# Patient Record
Sex: Male | Born: 1935 | Race: Black or African American | Hispanic: No | Marital: Single
Health system: Southern US, Community
[De-identification: ages and names within clinical notes are randomized; demographics above are authoritative.]

---

## 2007-03-07 ENCOUNTER — Emergency Department: Payer: Self-pay | Admitting: Emergency Medicine

## 2007-03-07 ENCOUNTER — Other Ambulatory Visit: Payer: Self-pay

## 2007-05-20 ENCOUNTER — Inpatient Hospital Stay: Payer: Self-pay | Admitting: Internal Medicine

## 2008-05-16 ENCOUNTER — Other Ambulatory Visit: Payer: Self-pay

## 2008-05-16 ENCOUNTER — Emergency Department: Payer: Self-pay | Admitting: Emergency Medicine

## 2009-03-26 ENCOUNTER — Emergency Department: Payer: Self-pay | Admitting: Unknown Physician Specialty

## 2009-08-21 ENCOUNTER — Emergency Department: Payer: Self-pay | Admitting: Emergency Medicine

## 2010-03-20 ENCOUNTER — Emergency Department: Payer: Self-pay | Admitting: Emergency Medicine

## 2011-07-29 ENCOUNTER — Emergency Department: Payer: Self-pay | Admitting: Emergency Medicine

## 2011-09-24 ENCOUNTER — Emergency Department: Payer: Self-pay | Admitting: Unknown Physician Specialty

## 2011-10-21 ENCOUNTER — Emergency Department: Payer: Self-pay | Admitting: Emergency Medicine

## 2011-10-21 LAB — COMPREHENSIVE METABOLIC PANEL
Albumin: 3.9 g/dL (ref 3.4–5.0)
Alkaline Phosphatase: 140 U/L — ABNORMAL HIGH (ref 50–136)
Anion Gap: 14 (ref 7–16)
BUN: 18 mg/dL (ref 7–18)
Bilirubin,Total: 0.6 mg/dL (ref 0.2–1.0)
Glucose: 125 mg/dL — ABNORMAL HIGH (ref 65–99)
Osmolality: 286 (ref 275–301)
Potassium: 4.5 mmol/L (ref 3.5–5.1)
Sodium: 142 mmol/L (ref 136–145)
Total Protein: 8.1 g/dL (ref 6.4–8.2)

## 2011-10-21 LAB — CBC
HCT: 37.4 % — ABNORMAL LOW (ref 40.0–52.0)
HGB: 12.7 g/dL — ABNORMAL LOW (ref 13.0–18.0)
MCH: 37.3 pg — ABNORMAL HIGH (ref 26.0–34.0)
MCHC: 34.1 g/dL (ref 32.0–36.0)
MCV: 110 fL — ABNORMAL HIGH (ref 80–100)
WBC: 3.8 10*3/uL (ref 3.8–10.6)

## 2011-10-21 LAB — PHENYTOIN LEVEL, TOTAL: Dilantin: 3.6 ug/mL — ABNORMAL LOW (ref 10.0–20.0)

## 2012-04-03 ENCOUNTER — Ambulatory Visit: Payer: Self-pay | Admitting: Otolaryngology

## 2012-04-15 ENCOUNTER — Ambulatory Visit: Payer: Self-pay | Admitting: Otolaryngology

## 2012-04-21 LAB — PATHOLOGY REPORT

## 2012-04-27 ENCOUNTER — Ambulatory Visit: Payer: Self-pay | Admitting: Radiation Oncology

## 2012-05-04 ENCOUNTER — Ambulatory Visit: Payer: Self-pay | Admitting: Radiation Oncology

## 2012-05-12 ENCOUNTER — Ambulatory Visit: Payer: Self-pay | Admitting: Vascular Surgery

## 2012-05-14 ENCOUNTER — Ambulatory Visit: Payer: Self-pay | Admitting: Radiation Oncology

## 2012-05-19 LAB — CBC CANCER CENTER
Basophil #: 0 x10 3/mm (ref 0.0–0.1)
Eosinophil %: 0.5 %
Lymphocyte #: 1.8 x10 3/mm (ref 1.0–3.6)
Lymphocyte %: 30.3 %
MCH: 36.6 pg — ABNORMAL HIGH (ref 26.0–34.0)
MCHC: 34.7 g/dL (ref 32.0–36.0)
MCV: 105 fL — ABNORMAL HIGH (ref 80–100)
Monocyte #: 1.2 x10 3/mm — ABNORMAL HIGH (ref 0.2–1.0)
Platelet: 202 x10 3/mm (ref 150–440)
RDW: 12.3 % (ref 11.5–14.5)

## 2012-05-19 LAB — COMPREHENSIVE METABOLIC PANEL
Alkaline Phosphatase: 167 U/L — ABNORMAL HIGH (ref 50–136)
Anion Gap: 8 (ref 7–16)
BUN: 22 mg/dL — ABNORMAL HIGH (ref 7–18)
Bilirubin,Total: 0.3 mg/dL (ref 0.2–1.0)
Chloride: 99 mmol/L (ref 98–107)
Creatinine: 0.92 mg/dL (ref 0.60–1.30)
EGFR (African American): >60
SGPT (ALT): 58 U/L (ref 12–78)
Sodium: 137 mmol/L (ref 136–145)
Total Protein: 8.5 g/dL — ABNORMAL HIGH (ref 6.4–8.2)

## 2012-05-20 ENCOUNTER — Emergency Department: Payer: Self-pay | Admitting: *Deleted

## 2012-05-20 LAB — CBC WITH DIFFERENTIAL/PLATELET
Basophil #: 0 10*3/uL (ref 0.0–0.1)
Eosinophil %: 0 %
HGB: 11.6 g/dL — ABNORMAL LOW (ref 13.0–18.0)
Lymphocyte #: 1 10*3/uL (ref 1.0–3.6)
Monocyte #: 1.3 x10 3/mm — ABNORMAL HIGH (ref 0.2–1.0)
Monocyte %: 15.2 %
Neutrophil %: 72.5 %
Platelet: 179 10*3/uL (ref 150–440)
RBC: 3.14 10*6/uL — ABNORMAL LOW (ref 4.40–5.90)
RDW: 12.4 % (ref 11.5–14.5)
WBC: 8.5 10*3/uL (ref 3.8–10.6)

## 2012-05-20 LAB — BASIC METABOLIC PANEL
BUN: 17 mg/dL (ref 7–18)
Calcium, Total: 9.6 mg/dL (ref 8.5–10.1)
Co2: 27 mmol/L (ref 21–32)
EGFR (African American): >60
Glucose: 95 mg/dL (ref 65–99)
Osmolality: 277 (ref 275–301)
Potassium: 4.4 mmol/L (ref 3.5–5.1)

## 2012-05-20 LAB — PHENYTOIN LEVEL, TOTAL: Dilantin: 1.3 ug/mL — ABNORMAL LOW (ref 10.0–20.0)

## 2012-05-26 LAB — CBC CANCER CENTER
Basophil #: 0 x10 3/mm (ref 0.0–0.1)
Basophil %: 0.3 %
Eosinophil #: 0 x10 3/mm (ref 0.0–0.7)
Eosinophil %: 0.4 %
HCT: 32.3 % — ABNORMAL LOW (ref 40.0–52.0)
HGB: 11 g/dL — ABNORMAL LOW (ref 13.0–18.0)
Lymphocyte #: 1.1 x10 3/mm (ref 1.0–3.6)
Lymphocyte %: 17.5 %
MCH: 35.5 pg — ABNORMAL HIGH (ref 26.0–34.0)
MCHC: 34.1 g/dL (ref 32.0–36.0)
MCV: 104 fL — ABNORMAL HIGH (ref 80–100)
Monocyte #: 1.4 x10 3/mm — ABNORMAL HIGH (ref 0.2–1.0)
Monocyte %: 22.3 %
Neutrophil #: 3.7 x10 3/mm (ref 1.4–6.5)
Neutrophil %: 59.5 %
Platelet: 206 x10 3/mm (ref 150–440)
RBC: 3.1 10*6/uL — ABNORMAL LOW (ref 4.40–5.90)
RDW: 12.2 % (ref 11.5–14.5)
WBC: 6.2 x10 3/mm (ref 3.8–10.6)

## 2012-05-26 LAB — COMPREHENSIVE METABOLIC PANEL
Anion Gap: 8 (ref 7–16)
BUN: 13 mg/dL (ref 7–18)
Bilirubin,Total: 0.3 mg/dL (ref 0.2–1.0)
Calcium, Total: 9.4 mg/dL (ref 8.5–10.1)
Chloride: 96 mmol/L — ABNORMAL LOW (ref 98–107)
Co2: 29 mmol/L (ref 21–32)
Creatinine: 0.72 mg/dL (ref 0.60–1.30)
EGFR (African American): >60
EGFR (Non-African Amer.): >60
Potassium: 3.8 mmol/L (ref 3.5–5.1)
SGOT(AST): 57 U/L — ABNORMAL HIGH (ref 15–37)
SGPT (ALT): 55 U/L (ref 12–78)
Sodium: 133 mmol/L — ABNORMAL LOW (ref 136–145)
Total Protein: 7.8 g/dL (ref 6.4–8.2)

## 2012-06-02 LAB — CBC CANCER CENTER
Eosinophil %: 0.4 %
HGB: 11.6 g/dL — ABNORMAL LOW (ref 13.0–18.0)
MCV: 105 fL — ABNORMAL HIGH (ref 80–100)
Monocyte %: 19.4 %
Neutrophil %: 66 %
Platelet: 218 x10 3/mm (ref 150–440)
WBC: 5.3 x10 3/mm (ref 3.8–10.6)

## 2012-06-02 LAB — COMPREHENSIVE METABOLIC PANEL
Alkaline Phosphatase: 169 U/L — ABNORMAL HIGH (ref 50–136)
Anion Gap: 6 — ABNORMAL LOW (ref 7–16)
BUN: 22 mg/dL — ABNORMAL HIGH (ref 7–18)
Bilirubin,Total: 0.3 mg/dL (ref 0.2–1.0)
Calcium, Total: 9.7 mg/dL (ref 8.5–10.1)
Chloride: 96 mmol/L — ABNORMAL LOW (ref 98–107)
Creatinine: 0.95 mg/dL (ref 0.60–1.30)
EGFR (African American): >60
Glucose: 64 mg/dL — ABNORMAL LOW (ref 65–99)
Potassium: 4.7 mmol/L (ref 3.5–5.1)
Total Protein: 8.5 g/dL — ABNORMAL HIGH (ref 6.4–8.2)

## 2012-06-02 LAB — PHENYTOIN LEVEL, TOTAL: Dilantin: 5.3 ug/mL — ABNORMAL LOW (ref 10.0–20.0)

## 2012-06-09 LAB — COMPREHENSIVE METABOLIC PANEL
Albumin: 3.3 g/dL — ABNORMAL LOW (ref 3.4–5.0)
Alkaline Phosphatase: 176 U/L — ABNORMAL HIGH (ref 50–136)
Anion Gap: 7 (ref 7–16)
BUN: 23 mg/dL — ABNORMAL HIGH (ref 7–18)
Bilirubin,Total: 0.3 mg/dL (ref 0.2–1.0)
Chloride: 96 mmol/L — ABNORMAL LOW (ref 98–107)
Co2: 31 mmol/L (ref 21–32)
Creatinine: 0.87 mg/dL (ref 0.60–1.30)
EGFR (African American): >60
EGFR (Non-African Amer.): >60
Glucose: 93 mg/dL (ref 65–99)
Osmolality: 272 (ref 275–301)
Potassium: 4.7 mmol/L (ref 3.5–5.1)
SGPT (ALT): 52 U/L (ref 12–78)
Sodium: 134 mmol/L — ABNORMAL LOW (ref 136–145)
Total Protein: 8.9 g/dL — ABNORMAL HIGH (ref 6.4–8.2)

## 2012-06-09 LAB — CBC CANCER CENTER
Basophil %: 0.4 %
Eosinophil #: 0 x10 3/mm (ref 0.0–0.7)
Eosinophil %: 0.4 %
HCT: 33.7 % — ABNORMAL LOW (ref 40.0–52.0)
HGB: 11.4 g/dL — ABNORMAL LOW (ref 13.0–18.0)
Lymphocyte %: 18.8 %
MCH: 35.3 pg — ABNORMAL HIGH (ref 26.0–34.0)
MCHC: 33.8 g/dL (ref 32.0–36.0)
Monocyte %: 20.5 %
Neutrophil #: 2.2 x10 3/mm (ref 1.4–6.5)
Neutrophil %: 59.9 %
RBC: 3.23 10*6/uL — ABNORMAL LOW (ref 4.40–5.90)

## 2012-06-09 LAB — MAGNESIUM: Magnesium: 1.9 mg/dL

## 2012-06-09 LAB — PHENYTOIN LEVEL, TOTAL: Dilantin: 9.6 ug/mL — ABNORMAL LOW (ref 10.0–20.0)

## 2012-06-14 ENCOUNTER — Ambulatory Visit: Payer: Self-pay | Admitting: Radiation Oncology

## 2012-06-17 LAB — CBC CANCER CENTER
Basophil #: 0 x10 3/mm (ref 0.0–0.1)
Eosinophil #: 0 x10 3/mm (ref 0.0–0.7)
HCT: 29.5 % — ABNORMAL LOW (ref 40.0–52.0)
Lymphocyte %: 14.8 %
MCH: 35.5 pg — ABNORMAL HIGH (ref 26.0–34.0)
MCHC: 34.1 g/dL (ref 32.0–36.0)
Monocyte %: 27.2 %
Neutrophil %: 57.1 %
Platelet: 147 x10 3/mm — ABNORMAL LOW (ref 150–440)
RBC: 2.83 10*6/uL — ABNORMAL LOW (ref 4.40–5.90)
RDW: 12.7 % (ref 11.5–14.5)
WBC: 4.3 x10 3/mm (ref 3.8–10.6)

## 2012-06-17 LAB — COMPREHENSIVE METABOLIC PANEL
Albumin: 3 g/dL — ABNORMAL LOW (ref 3.4–5.0)
Anion Gap: 5 — ABNORMAL LOW (ref 7–16)
BUN: 17 mg/dL (ref 7–18)
Bilirubin,Total: 0.3 mg/dL (ref 0.2–1.0)
Chloride: 98 mmol/L (ref 98–107)
Co2: 30 mmol/L (ref 21–32)
Creatinine: 0.83 mg/dL (ref 0.60–1.30)
EGFR (Non-African Amer.): >60
Glucose: 96 mg/dL (ref 65–99)
Osmolality: 268 (ref 275–301)
Potassium: 4.5 mmol/L (ref 3.5–5.1)
Sodium: 133 mmol/L — ABNORMAL LOW (ref 136–145)

## 2012-06-17 LAB — PHENYTOIN LEVEL, TOTAL: Dilantin: 11 ug/mL (ref 10.0–20.0)

## 2012-06-24 LAB — CBC CANCER CENTER
Basophil #: 0 x10 3/mm (ref 0.0–0.1)
Basophil %: 0.3 %
Eosinophil #: 0 x10 3/mm (ref 0.0–0.7)
Eosinophil %: 0.3 %
HCT: 28.3 % — ABNORMAL LOW (ref 40.0–52.0)
Lymphocyte #: 0.5 x10 3/mm — ABNORMAL LOW (ref 1.0–3.6)
MCH: 34.9 pg — ABNORMAL HIGH (ref 26.0–34.0)
MCV: 104 fL — ABNORMAL HIGH (ref 80–100)
Monocyte #: 1.2 x10 3/mm — ABNORMAL HIGH (ref 0.2–1.0)
Monocyte %: 26.7 %
Neutrophil #: 2.9 x10 3/mm (ref 1.4–6.5)
RDW: 13.5 % (ref 11.5–14.5)

## 2012-06-24 LAB — COMPREHENSIVE METABOLIC PANEL
Albumin: 2.9 g/dL — ABNORMAL LOW (ref 3.4–5.0)
Alkaline Phosphatase: 166 U/L — ABNORMAL HIGH (ref 50–136)
BUN: 23 mg/dL — ABNORMAL HIGH (ref 7–18)
Bilirubin,Total: 0.5 mg/dL (ref 0.2–1.0)
Calcium, Total: 9.2 mg/dL (ref 8.5–10.1)
Chloride: 97 mmol/L — ABNORMAL LOW (ref 98–107)
Co2: 26 mmol/L (ref 21–32)
Creatinine: 0.79 mg/dL (ref 0.60–1.30)
EGFR (African American): >60
EGFR (Non-African Amer.): >60
SGPT (ALT): 60 U/L (ref 12–78)

## 2012-07-13 ENCOUNTER — Inpatient Hospital Stay: Payer: Self-pay | Admitting: Oncology

## 2012-07-13 LAB — MAGNESIUM: Magnesium: 3.9 mg/dL — ABNORMAL HIGH

## 2012-07-13 LAB — CBC CANCER CENTER
Basophil #: 0 x10 3/mm (ref 0.0–0.1)
Lymphocyte #: 0.4 x10 3/mm — ABNORMAL LOW (ref 1.0–3.6)
MCH: 35.1 pg — ABNORMAL HIGH (ref 26.0–34.0)
MCHC: 33.7 g/dL (ref 32.0–36.0)
MCV: 104 fL — ABNORMAL HIGH (ref 80–100)
Monocyte #: 1 x10 3/mm (ref 0.2–1.0)
Neutrophil %: 82 %
Platelet: 78 x10 3/mm — ABNORMAL LOW (ref 150–440)
RDW: 15.4 % — ABNORMAL HIGH (ref 11.5–14.5)
WBC: 7.7 x10 3/mm (ref 3.8–10.6)

## 2012-07-13 LAB — BASIC METABOLIC PANEL
BUN: 114 mg/dL — ABNORMAL HIGH (ref 7–18)
Calcium, Total: 9.8 mg/dL (ref 8.5–10.1)
Chloride: 98 mmol/L (ref 98–107)
EGFR (African American): 16 — ABNORMAL LOW
EGFR (Non-African Amer.): 14 — ABNORMAL LOW
Glucose: 137 mg/dL — ABNORMAL HIGH (ref 65–99)
Osmolality: 314 (ref 275–301)
Potassium: 4.3 mmol/L (ref 3.5–5.1)
Sodium: 138 mmol/L (ref 136–145)

## 2012-07-14 ENCOUNTER — Ambulatory Visit: Payer: Self-pay | Admitting: Radiation Oncology

## 2012-07-14 LAB — URINALYSIS, COMPLETE
Bilirubin,UR: NEGATIVE
Glucose,UR: NEGATIVE mg/dL (ref 0–75)
Nitrite: NEGATIVE
Protein: 30
RBC,UR: 1 /HPF (ref 0–5)
Squamous Epithelial: 1
WBC UR: 6 /HPF (ref 0–5)

## 2012-07-14 LAB — CBC WITH DIFFERENTIAL/PLATELET
Basophil #: 0 10*3/uL (ref 0.0–0.1)
Basophil %: 0.2 %
Eosinophil %: 0.1 %
HCT: 23.5 % — ABNORMAL LOW (ref 40.0–52.0)
Lymphocyte #: 0.4 10*3/uL — ABNORMAL LOW (ref 1.0–3.6)
Lymphocyte %: 5.5 %
MCHC: 35.5 g/dL (ref 32.0–36.0)
Monocyte #: 0.9 x10 3/mm (ref 0.2–1.0)
Monocyte %: 13.3 %
Neutrophil %: 80.9 %
Platelet: 54 10*3/uL — ABNORMAL LOW (ref 150–440)
RBC: 2.29 10*6/uL — ABNORMAL LOW (ref 4.40–5.90)
RDW: 15.1 % — ABNORMAL HIGH (ref 11.5–14.5)
WBC: 6.4 10*3/uL (ref 3.8–10.6)

## 2012-07-14 LAB — COMPREHENSIVE METABOLIC PANEL
Albumin: 2.2 g/dL — ABNORMAL LOW (ref 3.4–5.0)
Alkaline Phosphatase: 147 U/L — ABNORMAL HIGH (ref 50–136)
Anion Gap: 12 (ref 7–16)
Bilirubin,Total: 0.3 mg/dL (ref 0.2–1.0)
Calcium, Total: 8.4 mg/dL — ABNORMAL LOW (ref 8.5–10.1)
Co2: 22 mmol/L (ref 21–32)
Creatinine: 2.55 mg/dL — ABNORMAL HIGH (ref 0.60–1.30)
EGFR (African American): 27 — ABNORMAL LOW
EGFR (Non-African Amer.): 24 — ABNORMAL LOW
Glucose: 84 mg/dL (ref 65–99)
Osmolality: 321 (ref 275–301)
Potassium: 4 mmol/L (ref 3.5–5.1)
SGPT (ALT): 45 U/L (ref 12–78)
Sodium: 146 mmol/L — ABNORMAL HIGH (ref 136–145)
Total Protein: 6.4 g/dL (ref 6.4–8.2)

## 2012-07-14 LAB — MAGNESIUM: Magnesium: 3.2 mg/dL — ABNORMAL HIGH

## 2012-07-15 LAB — BASIC METABOLIC PANEL
Anion Gap: 12 (ref 7–16)
Calcium, Total: 8.7 mg/dL (ref 8.5–10.1)
Chloride: 113 mmol/L — ABNORMAL HIGH (ref 98–107)
Co2: 23 mmol/L (ref 21–32)
EGFR (African American): 48 — ABNORMAL LOW
Osmolality: 314 (ref 275–301)
Potassium: 3.9 mmol/L (ref 3.5–5.1)
Sodium: 148 mmol/L — ABNORMAL HIGH (ref 136–145)

## 2012-07-15 LAB — CBC WITH DIFFERENTIAL/PLATELET
Basophil %: 0.1 %
Eosinophil #: 0 10*3/uL (ref 0.0–0.7)
Eosinophil %: 0.1 %
Lymphocyte #: 0.4 10*3/uL — ABNORMAL LOW (ref 1.0–3.6)
Lymphocyte %: 4.6 %
MCH: 36 pg — ABNORMAL HIGH (ref 26.0–34.0)
MCHC: 34.9 g/dL (ref 32.0–36.0)
Neutrophil %: 81 %

## 2012-07-15 LAB — PHENYTOIN LEVEL, TOTAL: Dilantin: 10.9 ug/mL (ref 10.0–20.0)

## 2012-07-16 LAB — CBC WITH DIFFERENTIAL/PLATELET
Basophil #: 0 10*3/uL (ref 0.0–0.1)
Eosinophil #: 0 10*3/uL (ref 0.0–0.7)
Eosinophil %: 0 %
Lymphocyte #: 0.5 10*3/uL — ABNORMAL LOW (ref 1.0–3.6)
MCH: 36.4 pg — ABNORMAL HIGH (ref 26.0–34.0)
MCHC: 35 g/dL (ref 32.0–36.0)
MCV: 104 fL — ABNORMAL HIGH (ref 80–100)
Monocyte #: 1.5 x10 3/mm — ABNORMAL HIGH (ref 0.2–1.0)
Monocyte %: 15.1 %
Neutrophil #: 7.8 10*3/uL — ABNORMAL HIGH (ref 1.4–6.5)
Platelet: 53 10*3/uL — ABNORMAL LOW (ref 150–440)
RDW: 15.8 % — ABNORMAL HIGH (ref 11.5–14.5)
WBC: 9.8 10*3/uL (ref 3.8–10.6)

## 2012-07-16 LAB — COMPREHENSIVE METABOLIC PANEL
Albumin: 1.6 g/dL — ABNORMAL LOW (ref 3.4–5.0)
Alkaline Phosphatase: 176 U/L — ABNORMAL HIGH (ref 50–136)
Anion Gap: 10 (ref 7–16)
BUN: 45 mg/dL — ABNORMAL HIGH (ref 7–18)
Calcium, Total: 8 mg/dL — ABNORMAL LOW (ref 8.5–10.1)
Co2: 23 mmol/L (ref 21–32)
EGFR (Non-African Amer.): 38 — ABNORMAL LOW
Glucose: 115 mg/dL — ABNORMAL HIGH (ref 65–99)
Osmolality: 294 (ref 275–301)
Potassium: 3.3 mmol/L — ABNORMAL LOW (ref 3.5–5.1)
SGOT(AST): 179 U/L — ABNORMAL HIGH (ref 15–37)
SGPT (ALT): 60 U/L (ref 12–78)
Sodium: 141 mmol/L (ref 136–145)

## 2012-07-16 LAB — URINE CULTURE

## 2012-07-17 LAB — COMPREHENSIVE METABOLIC PANEL
Albumin: 1.6 g/dL — ABNORMAL LOW (ref 3.4–5.0)
Alkaline Phosphatase: 157 U/L — ABNORMAL HIGH (ref 50–136)
BUN: 27 mg/dL — ABNORMAL HIGH (ref 7–18)
Bilirubin,Total: 0.6 mg/dL (ref 0.2–1.0)
Creatinine: 1.36 mg/dL — ABNORMAL HIGH (ref 0.60–1.30)
EGFR (African American): 59 — ABNORMAL LOW
EGFR (Non-African Amer.): 51 — ABNORMAL LOW
Glucose: 113 mg/dL — ABNORMAL HIGH (ref 65–99)
Osmolality: 280 (ref 275–301)
SGPT (ALT): 54 U/L (ref 12–78)
Total Protein: 5.5 g/dL — ABNORMAL LOW (ref 6.4–8.2)

## 2012-07-17 LAB — CBC WITH DIFFERENTIAL/PLATELET
Basophil #: 0 10*3/uL (ref 0.0–0.1)
Eosinophil %: 0.2 %
HCT: 21.5 % — ABNORMAL LOW (ref 40.0–52.0)
HGB: 7.6 g/dL — ABNORMAL LOW (ref 13.0–18.0)
Lymphocyte #: 0.5 10*3/uL — ABNORMAL LOW (ref 1.0–3.6)
MCH: 36.2 pg — ABNORMAL HIGH (ref 26.0–34.0)
MCHC: 35.5 g/dL (ref 32.0–36.0)
MCV: 102 fL — ABNORMAL HIGH (ref 80–100)
Monocyte #: 2 x10 3/mm — ABNORMAL HIGH (ref 0.2–1.0)
Monocyte %: 16.1 %
Platelet: 52 10*3/uL — ABNORMAL LOW (ref 150–440)
RBC: 2.11 10*6/uL — ABNORMAL LOW (ref 4.40–5.90)
WBC: 12.2 10*3/uL — ABNORMAL HIGH (ref 3.8–10.6)

## 2012-07-17 LAB — PHENYTOIN LEVEL, TOTAL: Dilantin: 9.6 ug/mL — ABNORMAL LOW (ref 10.0–20.0)

## 2012-07-21 LAB — CULTURE, BLOOD (SINGLE)

## 2012-08-14 ENCOUNTER — Ambulatory Visit: Payer: Self-pay | Admitting: Radiation Oncology

## 2012-08-14 DEATH — deceased

## 2013-04-19 IMAGING — XA IR VASCULAR PROCEDURE
1 series · 4 of 4 positions shown · non-contrast
Comparison: none

[Series 1: run · 4 of 27 frames shown]
[frame 5/27]
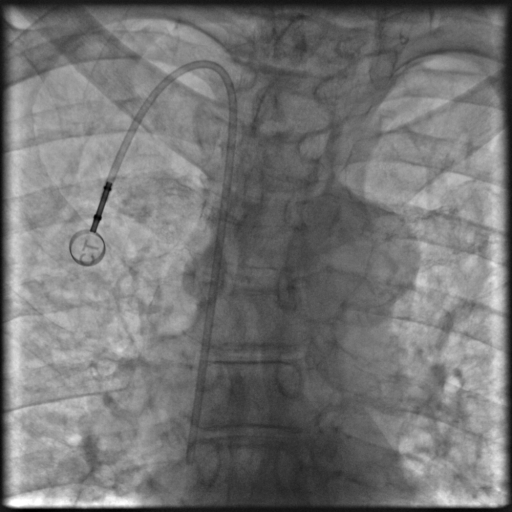
[frame 6/27]
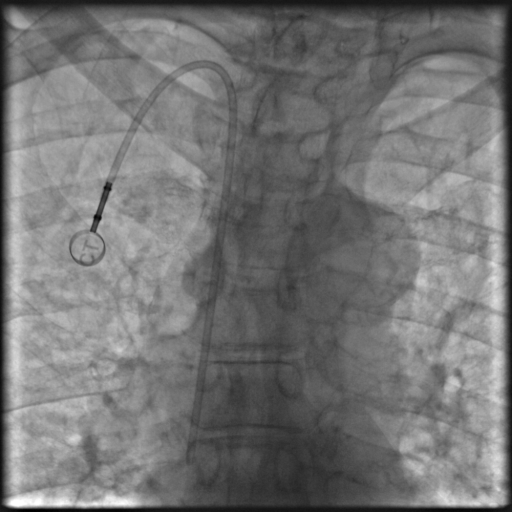
[frame 14/27]
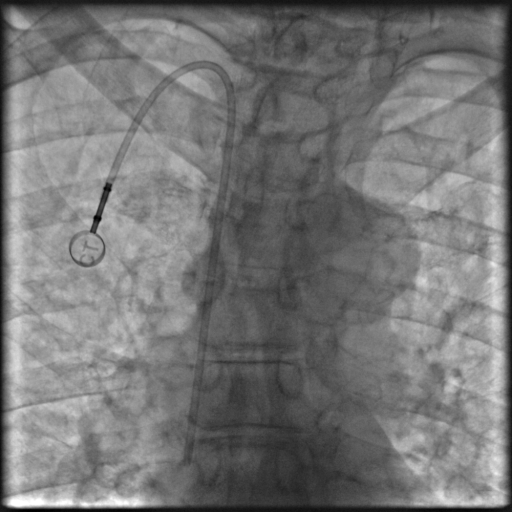
[frame 23/27]
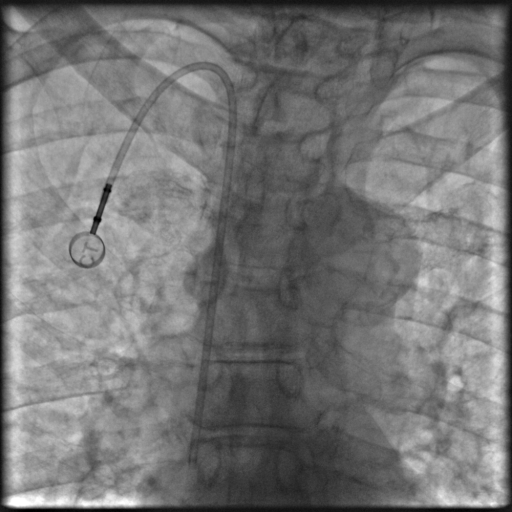

[4 of 4 positions shown; findings below may reference images not displayed]

IMAGES IMPORTED FROM THE SYNGO WORKFLOW SYSTEM
NO DICTATION FOR STUDY

## 2013-06-20 IMAGING — CR DG CHEST 1V
1 series · 1 of 1 positions shown · non-contrast
Comparison: none

REASON FOR EXAM: cough. ca tongue
COMMENTS:

PROCEDURE:     DXR - DXR CHEST 1 VIEWAP OR PA  - July 13, 2012  [DATE]
RESULT:     Comparison: 04/03/2012

[x chest ap]
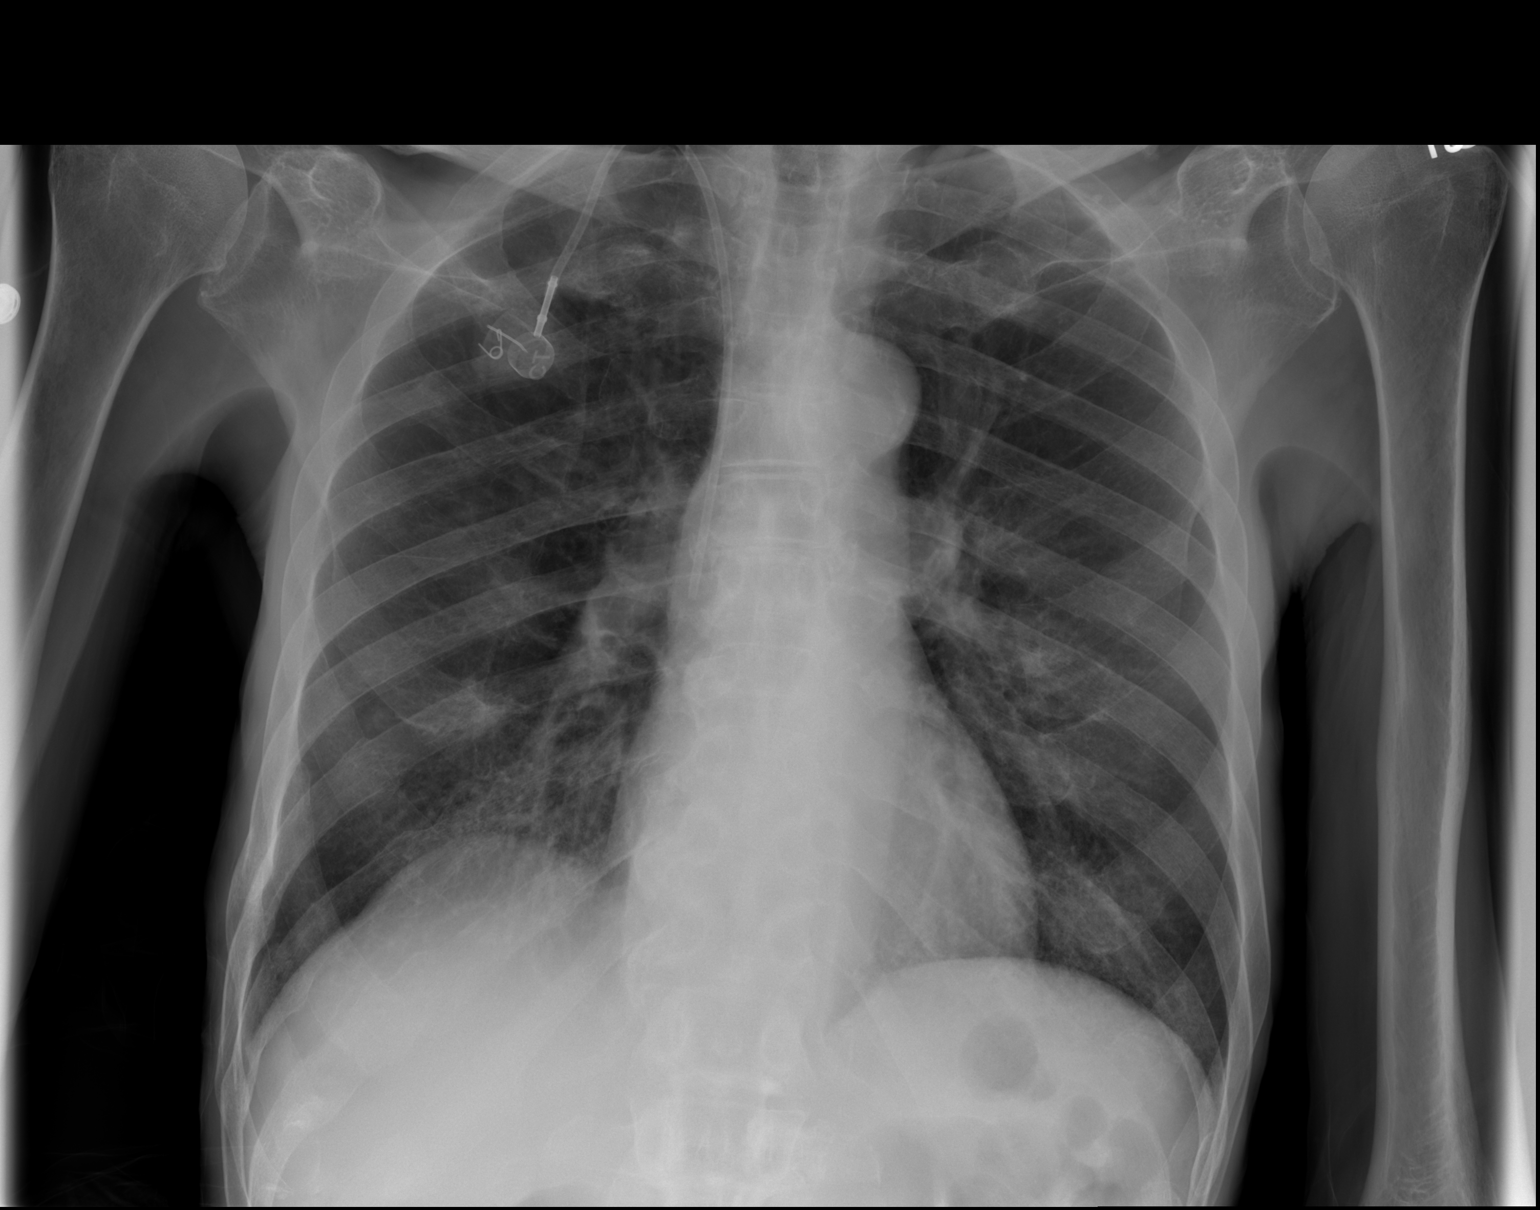

[1 of 1 positions shown; findings below may reference images not displayed]

FINDINGS: Right IJ portacatheter tip terminates over the SVC. The heart and
mediastinum are stable. Minimal right basilar opacities may be secondary to
atelectasis.
IMPRESSION: Minimal right basilar opacities may be secondary to atelectasis. Other
etiology such as early infection or aspiration are not excluded. Followup PA
and lateral chest radiograph is suggested.

[REDACTED]

## 2013-06-21 IMAGING — US US RENAL KIDNEY
1 series · 14 of 25 positions shown · non-contrast
Comparison: none

REASON FOR EXAM: ARF
COMMENTS:

[Series 1: us renal kidney · 0.26mm/px · 14 of 45 slices shown]
[im 1/45]
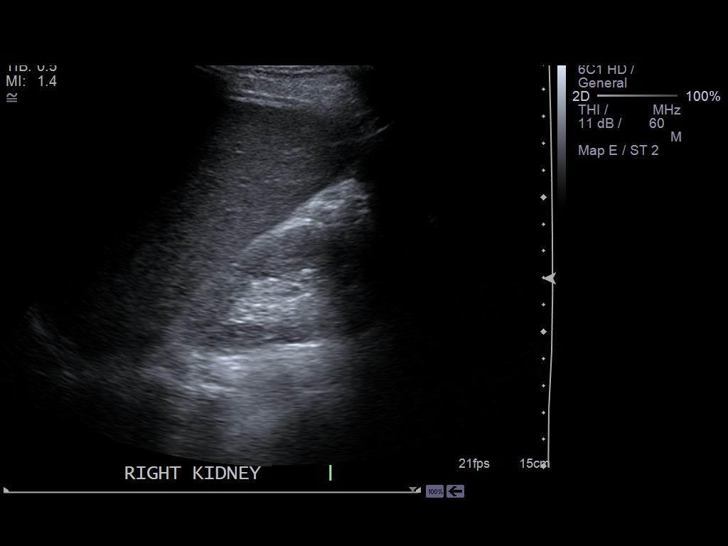
[im 4/45]
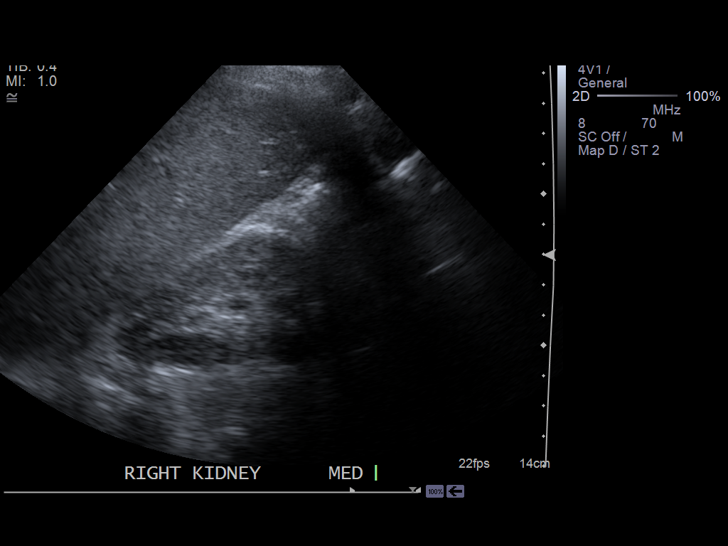
[im 8/45]
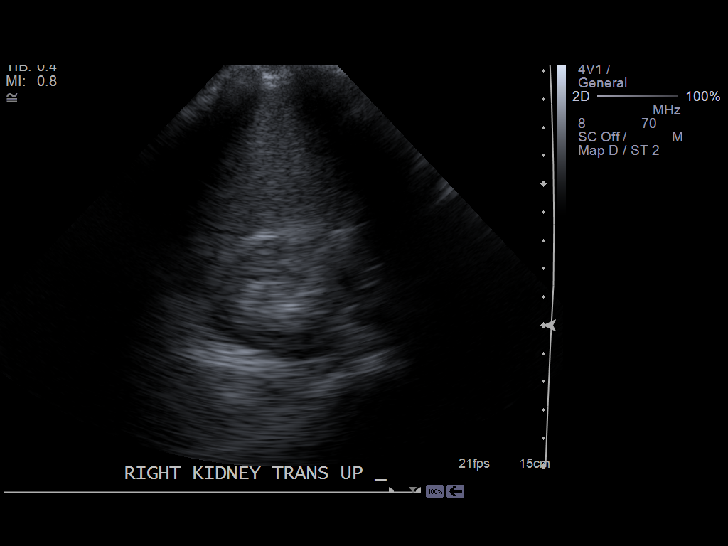
[im 12/45]
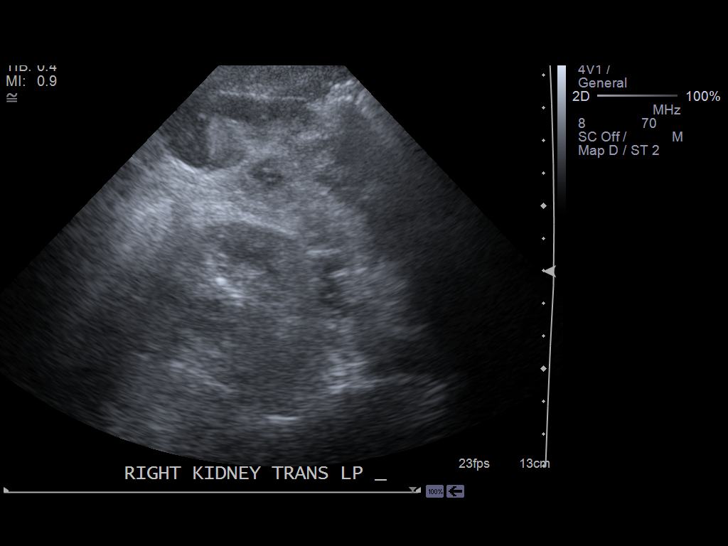
[im 15/45]
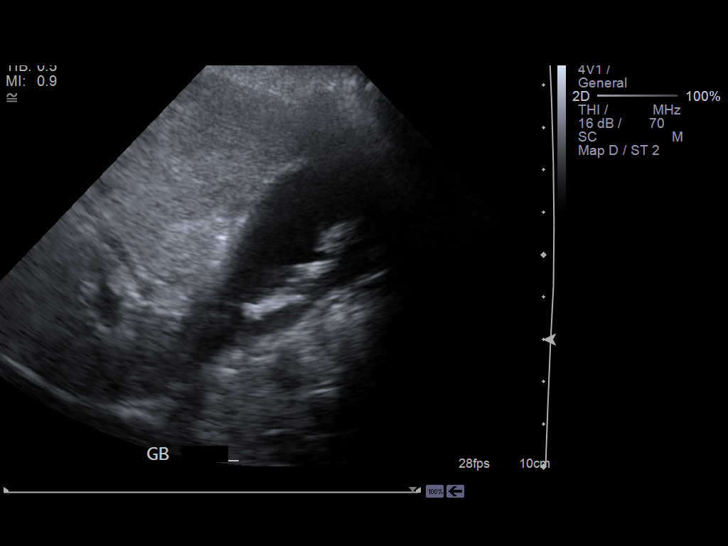
[im 17/45]
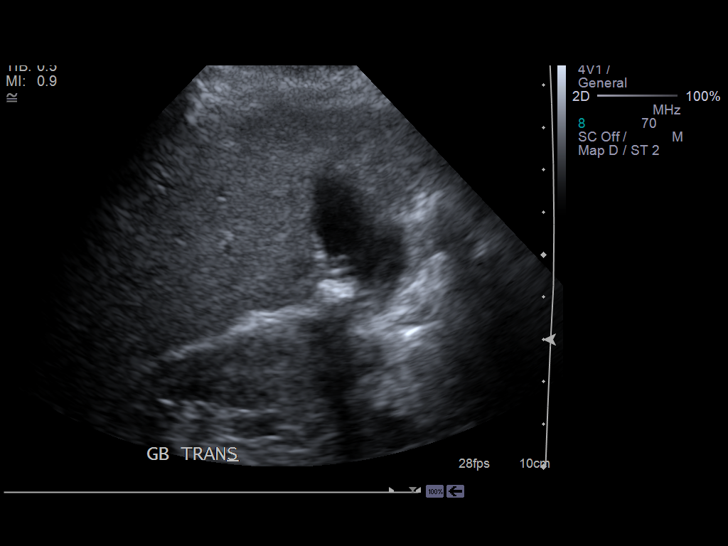
[im 21/45]
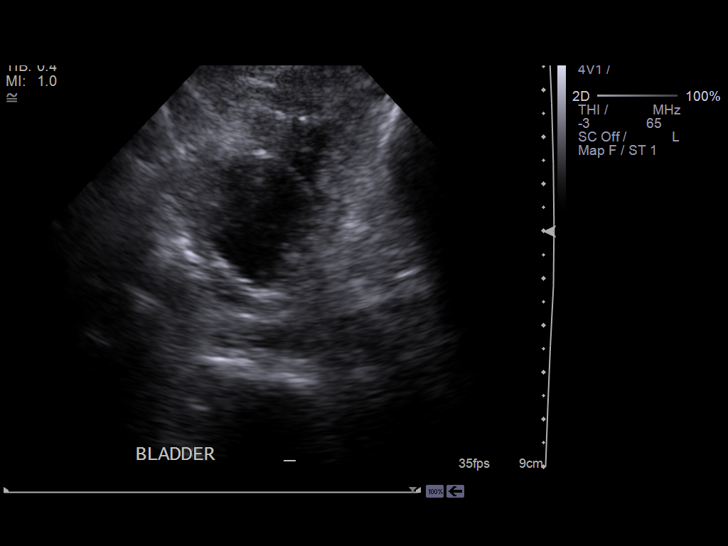
[im 24/45]
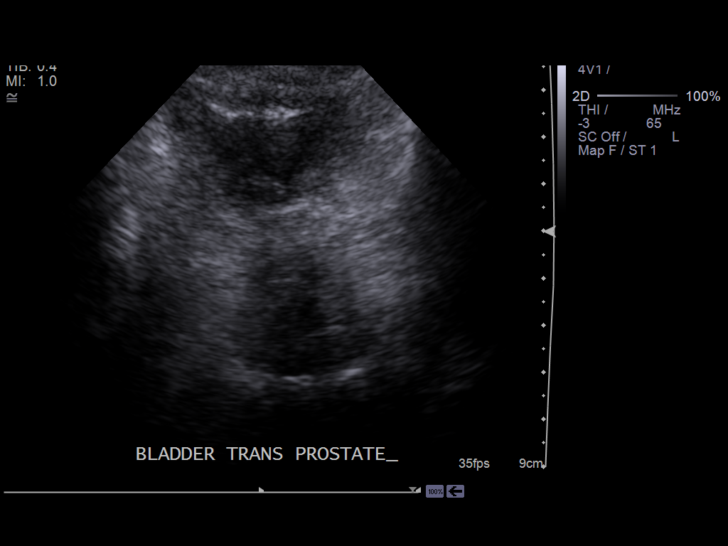
[im 28/45]
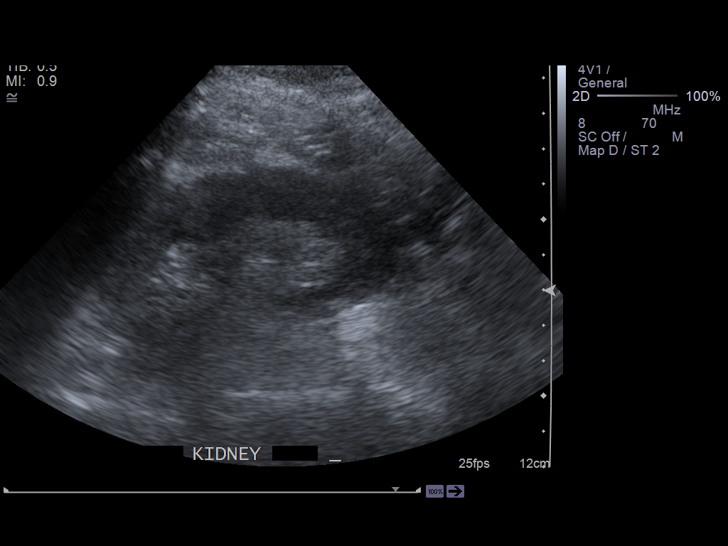
[im 30/45]
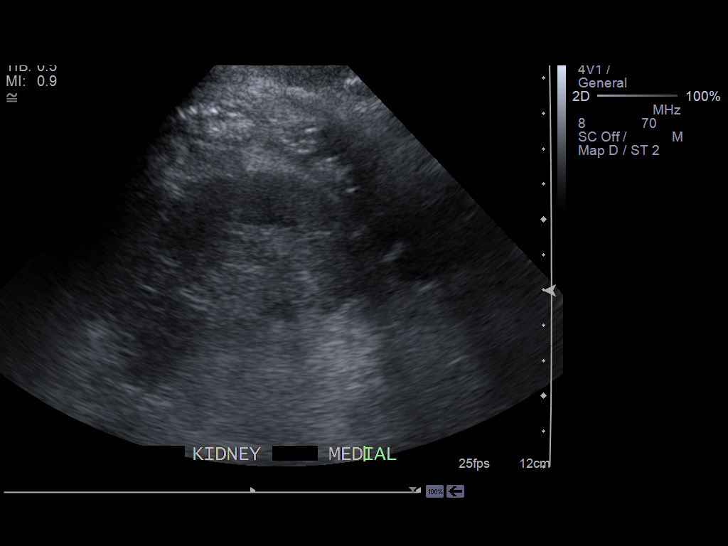
[im 34/45]
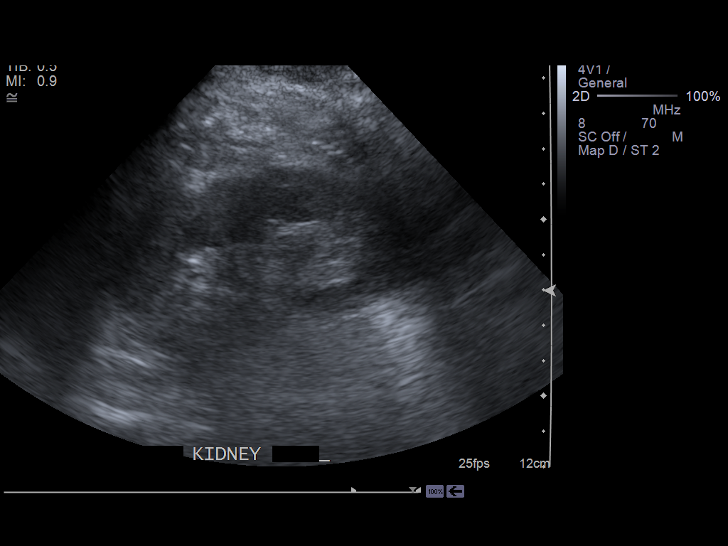
[im 37/45]
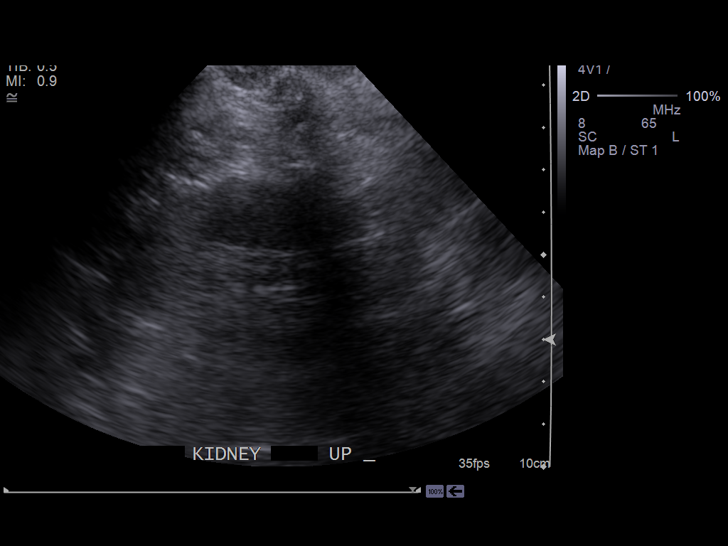
[im 41/45]
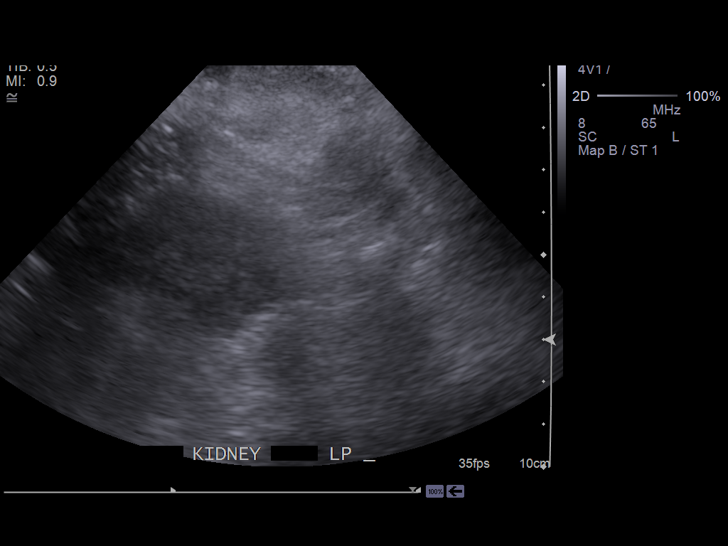
[im 45/45]
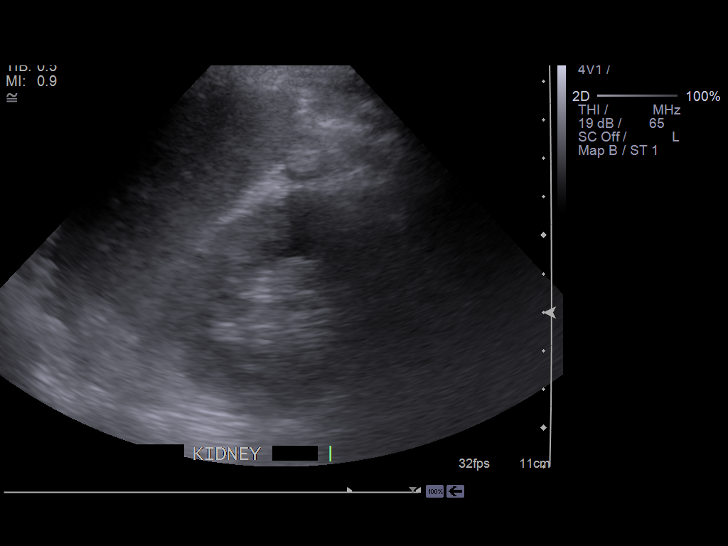

[14 of 25 positions shown; findings below may reference images not displayed]

PROCEDURE:     US  - US KIDNEY  - July 14, 2012  [DATE]

RESULT:     The right kidney measures 9.6 cm in length and the left kidney
measures 8.9 cm in length. Neither kidney exhibits evidence of
hydronephrosis or calcified stones. There are no perinephric fluid
collections. The echotexture of the renal cortex on the right is nearly
equal that of the adjacent liver which suggests medical renal disease. The
urinary bladder contains a Foley catheter and is relatively nondistended.
IMPRESSION: 1. Mildly increased echotexture of the renal cortex of the right kidney
suggest medical renal disease.
2. There is no evidence of obstruction nor of perinephric inflammatory
changes.
3. The bladder does not appear abnormally distended.
4. Incidental note is made of the presence of multiple mobile shadowing
gallstones.

[REDACTED]

## 2015-01-31 NOTE — Op Note (Signed)
PATIENT NAME:  Luke ShoresCHAMBERS, Jahsiah J MR#:  [id number] DATE OF BIRTH:  [birth year]  DATE OF PROCEDURE:  05/12/2012  PREOPERATIVE DIAGNOSIS: Cancer of the base of the tongue and tonsils.   POSTOPERATIVE DIAGNOSIS: Cancer of the base of the tongue and tonsils.   PROCEDURE PERFORMED: Insertion right IJ Infuse-a-Port.   PROCEDURE PERFORMED: Renford DillsGregory G. Schnier, MD   SEDATION: Versed 2 mg.   ACCESS: Right IJ.   CONTRAST USED: None.   FLUORO TIME: Less than one minute.   INDICATIONS: Mr. [name] is a 80 year old gentleman who underwent biopsy for a suspicious lesion of the base of the tongue and tonsils by Dr. Willeen CassBennett. Pathology returned carcinoma. He has subsequently undergone radiation treatments and will now be beginning chemotherapy. Risks and benefits for Infuse-a-Port placement were reviewed. The patient has agreed to proceed.   PROCEDURE: The patient is taken to the Cardiac Cath Lab and placed in the supine position. After adequate sedation his right neck and chest wall are prepped and draped in a sterile fashion. 1% lidocaine is infiltrated in the soft tissues and on the chest wall. Ultrasound is placed in a sterile sleeve. Ultrasound is utilized secondary to lack of appropriate landmarks and to avoid vascular injury. Under direct ultrasound visualization, jugular vein is identified. It is echolucent, homogeneous, and easily compressible indicating patency. Seldinger needle is inserted with real-time visualization after image is recorded and a J-wire is advanced under fluoroscopy into the inferior vena cava.   An 11 blade is then used to make a counterincision at the wire insertion site as well as a linear incision two fingerbreadths below the clavicle. Dissection is then carried out with blunt dissection as well as Metzenbaum scissors to create a small pocket for the port. After testing the pocket size, the dilator peel-away sheath is inserted over the wire. The catheter is pulled subcutaneously  with a tunneling device. Wire and dilator are removed and the catheter is fed into the vascular system and the peel-away sheath is removed. Under fluoroscopy the catheter is then positioned with its tip at the atriocaval junction. Catheter is then transected. The hub is connected and slipped into the pocket. Huber needle is then used to access the port percutaneously and it aspirates quite easily and flushes well. The pocket incision is then closed using interrupted 3-0 Vicryl. The counterincision is closed with 4-0 Monocryl and the skin is closed with 4-0 Monocryl over the pocket. Dermabond is applied. The patient tolerated the procedure well. There were no immediate complications. He was taken to the recovery area in excellent condition.    ____________________________ Renford DillsGregory G. Schnier, MD ggs:drc D: 05/12/2012 15:11:31 ET T: 05/12/2012 16:04:05 ET JOB#: [id number]  cc: Renford DillsGregory G. Schnier, MD, <Dictator> Gerome SamJanak K. Doylene Canninghoksi, MD Ollen GrossPaul S. Willeen CassBennett, MD Rebeca AlertGlenn S. Chrystal, MD Kristopher OppenheimJane D. Shawn StallHollingsworth, MD Renford DillsGREGORY G SCHNIER MD ELECTRONICALLY SIGNED 05/24/2012 13:20

## 2015-01-31 NOTE — Consult Note (Signed)
Brief Consult Note: Diagnosis: possible peg.   Patient was seen by consultant.   Consult note dictated.   Discussed with Attending MD.   Comments: Please see GI consult.  Family declining PEG, with consideration of patients wishes.  No PEG planned.  Will sign off.  Electronic Signatures: Barnetta ChapelSkulskie, Martin (MD)  (Signed 03-Oct-13 17:33)  Authored: Brief Consult Note   Last Updated: 03-Oct-13 17:33 by Barnetta ChapelSkulskie, Martin (MD)

## 2015-01-31 NOTE — Consult Note (Signed)
PATIENT NAME:  Luke Barajas, Luke Barajas MR#:  [id number] DATE OF BIRTH:  11-19-1934  DATE OF CONSULTATION:  07/14/2012  REFERRING PHYSICIAN:  Johney Maine, MD CONSULTING PHYSICIAN:  Somer Trotter Lizabeth Leyden, MD  REASON FOR CONSULTATION: Severe acute renal failure.   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 The patient was diagnosed with squamous cell carcinoma of the base of the left tongue on 04/15/2012. He had a biopsy at that point in time. He was subsequently seen by Dr. Aggie Cosier for initiation of radiation therapy. Subsequent to this he was started on weekly cisplatin therapy. The patient's tumor has been unresectable as it is locally advanced. The patient has been residing at [facility] and also undergoes care at [facility]. We are now called for the evaluation and management of severe acute renal failure. Baseline serum creatinine is 0.79. Creatinine upon presentation was 3.9; it is now down to 2.5. As above, he has been receiving cisplatin, which does have direct nephrotoxic potential. He is also noted to be anemic today with a hemoglobin of 8.3 and also has signs of thrombocytopenia with platelet count of 54,000. He had a urinalysis performed which showed urine protein of 30 mg/dL. One RBC was noted per high-power field and 6 WBCs were noted per high-power field. The patient is an extremely poor historian and unable to offer any history regarding his present illness. Luckily the patient's sister was at bedside and was able to provide some input. The patient has had rather poor p.o. intake given his locally advanced tongue  cancer.   PAST MEDICAL HISTORY:  1. Squamous cell carcinoma of the base of the tongue, on the left side, treated with radiation therapy as well as cisplatin therapy.  2. Hypothyroidism.  3. Anemia.  4. Optic neuropathy.  5. Glaucoma.  6. Peripheral neuropathy.  7. Tobacco abuse.  8. History of alcohol abuse.  9. Seizure disorder.   PAST SURGICAL HISTORY:  1. Appendectomy.  2. Tongue biopsy by Dr. Willeen Cass.   ALLERGIES: No known drug allergies.   CURRENT INPATIENT MEDICATIONS:  1. Normal saline at 150 mL/h. 2. Fosphenytoin 300 mg IV daily. 3. Zofran 4 mg IV every 4 to 6 hours p.r.n. nausea and vomiting.  4. Travoprost 0.004% ophthalmic drops one drop in both eyes at bedtime.   SOCIAL HISTORY: The patient resides at [facility]  He is unmarried. He has one son. He used to work as a [employer] architect. He has a history of significant tobacco abuse, unclear how many pack-years at this time. There is also history of alcohol abuse. No reported illicit drug use.   FAMILY HISTORY: The patient's mother died secondary to congestive heart failure. The patient's father died in a motor vehicle accident.  REVIEW OF SYSTEMS: Currently unobtainable from the patient directly given the fact that he is a poor historian and has difficulties verbalizing at present.   PHYSICAL EXAMINATION:   VITAL SIGNS: Temperature 98.5, pulse 69, respirations 22, blood pressure 101/53, and pulse oximetry 96%.   GENERAL: A cachectic-appearing African American male currently in no acute distress.   HEENT: Normocephalic, atraumatic. However, there is temporal wasting noted. Pupils are equal, round, and reactive to light, muddy brown  sclera noted. The patient has bilateral arcus senilis. The patient is hard of hearing. Oral mucosa are quite dry. Native dentition is quite poor.   NECK: Supple and without JVD at present.   PULMONARY: Lungs are clear to auscultation bilaterally with normal respiratory effort.   HEART:  S1 and S2 regular rate and rhythm. No murmurs or rubs appreciated.   ABDOMEN: Soft, nontender, and nondistended. Bowel sounds positive. No rebound or guarding. No gross organomegaly appreciated.   EXTREMITIES: No clubbing or cyanosis noted. Trace bilateral lower extremity edema noted.   NEUROLOGIC: The patient does move upper and lower extremities to command, although he appears to be quite weak in his lower extremities.   SKIN: Warm and dry. No rashes noted.   MUSCULOSKELETAL: No joint redness, swelling, or tenderness appreciated.   GU: No suprapubic tenderness is noted at this time.   PSYCHIATRIC: The patient has a rather flat affect. He has very poor insight into his current illness.   LABORATORY DATA: Sodium 146, potassium 4, chloride 112, CO2 22, BUN 99, and creatinine 2.5. LFTs show total protein 6.4, albumin 2.2, total bilirubin 0.3, alkaline phosphatase 147, AST 45, and ALT 45. Dilantin 10.3. CBC shows WBC 6.4, hemoglobin 8.3, hematocrit 23, and platelets 54,000.   Urinalysis shows urine protein 30 mg/dL, 1 RBC per high-power field, and 6 WBCs per high-power field.   IMPRESSION: This is a 80 year old African American male with past medical history of locally advanced squamous cell carcinoma at the base of the tongue, hypothyroidism, anemia, optic neuropathy, glaucoma, peripheral neuropathy, tobacco abuse, and seizure disorder who presented to [facility] with acute renal failure, volume depletion. He is also now found to have high hypernatremia.   PROBLEM LIST:  1. Acute renal failure.  2. Severe volume depletion.  3. Hypernatremia with serum sodium 146.  4. Squamous cell carcinoma at the base of the tongue.  5. Anemia with a hemoglobin of 8.3.  6. Thrombocytopenia likely related to chemotherapy.   PLAN: The patient presents with a somewhat severe illness at this point in time. His acute renal failure is likely multifactorial with poor p.o. intake  contributing as well as direct nephrotoxic effects of chemotherapy including cisplatin therapy. It appears that creatinine is improving with hydration. Therefore, we recommend continued hydration. I also agree with renal ultrasound. The patient has also developed hypernatremia. Therefore, we will discontinue normal saline in favor of half-normal saline to be given at 100 mL/hour. It will likely take several days before his renal function improves. Half normal saline should also treat his underlying volume depletion. The patient also has anemia as well as thrombocytopenia which are likely related to chemotherapy. We would recommend continued monitoring of the CBC. Dr. Doylene Canninghoksi is following on the case. His overall prognosis appears to be quite guarded at this point in time. I would like to thank Dr. Doylene Canninghoksi for this kind referral. Further plan as the patient progresses. ____________________________ Lennox PippinsMunsoor N. Kamiryn Bezanson, MD mnl:slb D: 07/14/2012 12:29:30 ET T: 07/14/2012 12:39:19 ET JOB#: [id number]  cc: Lennox PippinsMunsoor N. Jaslen Adcox, MD, <Dictator> Ria CommentMUNSOOR N Maeli Spacek MD ELECTRONICALLY SIGNED 07/21/2012 23:30

## 2015-01-31 NOTE — Consult Note (Signed)
    Comments   Received a call from [place] (pt's sister) who says that she has had a family meeting with other siblings and pt's immediate family. She says that family has decided not to pursue PEG tube as they do not feel this is what the patient would want. Gwynn understands that PEG may improve pt's acute condition but she feels like this would be prolonging a gradual decline. They ask if pt instead can be [facility]. Will communicate family wishes with medical and hospice team.  35 minutes  [place]:  Photographerlectronic Signatures: Borders, Daryl EasternJoshua R (NP)  (Signed 03-Oct-13 17:03)  Authored: Palliative Care   Last Updated: 03-Oct-13 17:03 by Malachy MoanBorders, Joshua R (NP)

## 2015-01-31 NOTE — Consult Note (Signed)
Reason for Visit: This 80 year old Male patient presents to the clinic for initial evaluation of .   Referred by Dr. Willeen CassBennett.  Diagnosis:   Chief Complaint/Diagnosis   squamous a carcinoma of the base of tongue as yet not completely staged   Pathology Report Pathology report reviewed    Imaging Report CT scan reviewed    Referral Report Clinical no sibling operative report reviewed    Planned Treatment Regimen Palliative radiation therapy    HPI   patient is a 80 year old male who presented with a crit seen head and neck pain and dysphasia found to have a left-sided base of tongue mass. He is a heavy smoker and heavy every hour abuse history. He has a seizure disorder and takes Dilantin and gabapentin for that. He was taken to the operating room and a large mass involving left tonsillar fossa and left base of tongue was seen. Biopsy was positive for squamous cell carcinoma. Tumor did extend down to the base of tongue and near the vallecula and include the entire left tonsillar fossa extending over to the edge of the uvula on the left side. Tumor did approach on the left retromolar trigone. Patient seen today for consideration of treatment. He has significant trismus making oral examination and possible.  Past Hx:    Hypothyroidism:    anemia:    Optic neuropathy:    Glaucoma:    Hard of Hearing:    Peripheral Neuropathy:    smoker:    etoh abuse:    seizures:    Appendectomy:    depression:    neck surgery:   Past, Family and Social History:   Past Medical History positive    Endocrine hypothyroidism    Neurological/Psychiatric depression; Seizure disorder    Past Surgical History appendectomy    Past Medical History Comments Anemia, optic neuropathy, glaucoma, peripheral neuropathy,    Family History noncontributory    Social History positive    Social History Comments Significant smoking history as well as EtOH abuse history.    Additional Past  Medical and Surgical History Seen by himself today.   Allergies:   No Known Allergies:   Home Meds:  Home Medications: Medication Instructions Status  alprazolam 1 mg oral tablet 1  orally once prior to test/procedure,for Agitation Active  Lortab Elixir  orally ,for Pain Active  gabapentin 300 mg oral capsule cap(s) orally once a day Active  levothyroxine 25 mcg (0.025 mg) oral tablet 1 tab(s) orally once a day Active  Drisdol 50,000 intl units (1.25 mg) oral capsule 1 cap(s) orally once a month Active  Ativan 0.5 mg oral tablet 1 tab(s) orally , As Needed Active  Nicorelief 2mg  gum 1 dose(s) orally every 2-3 hours PRN Active  Phenytek 300 mg oral capsule, extended release 1 cap(s) orally once a day (in the morning) Active  Travatan Z 0.004% ophthalmic solution 1 drop(s) to each affected eye once a day Active  Vitamin B Complex  1 cap(s) orally once a day Active  magnesium oxide 400 mg (241.3 mg elemental magnesium) oral tablet 1 tab(s) orally once a day Active  Robitussin 100 mg/5 mL oral liquid 5 milliliter(s) orally every 4 hours, As Needed Active  Detrol LA 2 mg oral capsule, extended release 1 cap(s) orally once a day (in the morning) Active  Bactroban 2% nasal ointment 1 application nasal 2 times a day to ear Active  Astelin 137 mcg/inh nasal spray 2 spray(s) nasal 2 times a day Active  Mucinex DM 1 cap(s) orally every 12 hours, As Needed Active   Review of Systems:   Performance Status (ECOG) 1    Skin negative    Breast negative    Ophthalmologic negative    ENMT see HPI    Respiratory and Thorax negative    Cardiovascular negative    Gastrointestinal negative    Genitourinary negative    Musculoskeletal negative    Neurological see HPI    Psychiatric negative    Hematology/Lymphatics negative    Endocrine see HPI    Allergic/Immunologic negative   Physical Exam:  General/Skin/HEENT:   Additional PE Well-developed Male in with slurred speech with  difficulty interpreting his thought pattern. Oral cavity has teeth are in poor state of repair. He has significant trismus. Oral examination beyond oral cavity is impossible. Neck has some small shotty lymph nodes present bilaterally. No supraclavicular adenopathy is appreciated. Lungs are clear to A&P cardiac examination shows regular rate and rhythm.   Breasts/Resp/CV/GI/GU:   Respiratory and Thorax normal    Cardiovascular normal    Gastrointestinal normal    Genitourinary normal   MS/Neuro/Psych/Lymph:   Musculoskeletal normal    Neurological normal    Lymphatics normal   Assessment and Plan:  Impression:   locally advanced squamous cell carcinoma base of tongue in 80 year old male with staging as yet incomplete.  Plan:   at this time ordered a PET/CT scan to clearly delineate the extent of his disease. I have also set up appointment with Dr. Doylene Canning for evaluation. Patient may benefit from concurrent chemoradiation although this if this lesion is or to metastatic which should there is a good chance of would offer palliative radiation therapy instead. I have set him up for followup today's after his PET/CT and will review those results at that time with medical oncology. I have reviewed risks and benefits of radiation therapy with the patient. We will make final decision planning on his PET/CT.  I would like to take this opportunity to thank you for allowing me to continue to participate in this patient's care.  CC Referral:   cc: Dr. Willeen Cass, Dr. Vickki Muff   Electronic Signatures: Rushie Chestnut, Gordy Councilman (MD)  (Signed 15-Jul-13 15:39)  Authored: HPI, Diagnosis, Past Hx, PFSH, Allergies, Home Meds, ROS, Physical Exam, Encounter Assessment and Plan, CC Referring Physician   Last Updated: 15-Jul-13 15:39 by Rebeca Alert (MD)

## 2015-01-31 NOTE — Consult Note (Signed)
Brief Consult Note: Diagnosis: NV, failure to thrive.   Patient was seen by consultant.   Comments: Appreciate consult for 80 y/o [place] with history of squamous cell carcinoma to the posterior tongue with extension up the pharynx, s/p chemotherapy/radiation, seizure disorder, tobacco/etoh abuse, thrombocytopenia, and ARF for possible PEG placement due to dysphagia. Patient is confused and unable to provide ROS. There has been a palliative care consult with his sisters, and they are deciding if this would be consistent with his wishes. He is at high risk for invasive procedures.  Impression and plan:Dysphagia.  would only proceed with PEG if family feels he would want this. May want to have open PEG v. endoscopic placement due to disease process and health issues, as his cancer may preclude passage of endoscope. Addendum: since writing the above, I have spoken with Dr Marva PandaSkulskie and his family has elected for comfort care/hospice.  Electronic Signatures: Vevelyn PatLondon, Rema Lievanos H (NP)  (Signed 03-Oct-13 17:37)  Authored: Brief Consult Note   Last Updated: 03-Oct-13 17:37 by Keturah BarreLondon, Nuh Lipton H (NP)

## 2015-01-31 NOTE — Consult Note (Signed)
PATIENT NAME:  Luke ShoresCHAMBERS, Chanze J MR#:  [id number] DATE OF BIRTH:  07/24/1935  DATE OF CONSULTATION:  07/17/2012  REFERRING PHYSICIAN:   CONSULTING PHYSICIAN:  Keturah Barrehristiane H. London, NP  HISTORY OF PRESENT ILLNESS: Mr. Luke Barajas is a 80 year old African American man with a history of squamous cell carcinoma to the posterior tongue with extension of the pharynx status post chemotherapy radiation, seizure disorder, tobacco, alcohol abuse, thrombocytopenia and ARF. GI has been consulted at the request of Dr. Doylene Canninghoksi to evaluate for possible PEG placement due to dysphagia. Patient is confused and unable to provide review of systems. There has been a palliative care consult with his sisters today and they are deciding if this would be consistent with his wishes. In chart review he is high risk for invasive procedures. Has a platelet count of 53, his kidneys are compromised, his liver is somewhat compromised and per the nursing/care management staff patient appears to have declined over the last three weeks. Again family is unsure if this patient would want a PEG and so at this time we will defer further assessment until that decision is made. In consideration of his health issues it may be appropriate to consider an open PEG versus endoscopic placement due to disease process and health issues as his cancer may preclude passage of the endoscope.   These services were provided by Vevelyn Pathristiane London, MSN, [facility] in collaboration with Dr. Marva PandaSkulskie with whom I discussed this patient in full.  ____________________________ Keturah Barrehristiane H. London, NP chl:cms D: 07/17/2012 08:28:15 ET T: 07/17/2012 08:54:53 ET JOB#: [id number]  cc: Keturah Barrehristiane H. London, NP, <Dictator> Eustaquio MaizeHRISTIANE H LONDON FNP ELECTRONICALLY SIGNED 08/01/2012 7:46

## 2015-02-05 NOTE — Op Note (Signed)
PATIENT NAME:  Luke ShoresCHAMBERS, Vontrell J MR#:  [id number] DATE OF BIRTH:  [birth year]  DATE OF PROCEDURE:  04/15/2012  PREOPERATIVE DIAGNOSIS: Neoplasm left tonsil and tongue base.   POSTOPERATIVE DIAGNOSIS: Neoplasm left tonsil and tongue base.   PROCEDURES:  1. Direct laryngoscopy with biopsies.  2. Rigid esophagoscopy.   SURGEON: Havery MorosP. Scott Vasilisa Vore, MD   ANESTHESIA: General endotracheal.   INDICATIONS: The patient is with a large mass involving left tonsillar fossa and tongue base and presents for biopsies and staging. He has a history of dysphagia.   FINDINGS: This was a large exophytic tumor involving more than 50% of the left base of tongue clearly crossing midline palpably. The larynx was not involved. It did extend down into the submucosal aspect of the base of tongue near the vallecula but there was no obvious involvement of the larynx itself. It also extended up involving entire tonsillar fossa, anterior and posterior tonsillar pillars and over onto the edge of the uvula on the left side. The retromolar trigone appeared to be clear palpably although the tumor did encroach in this region. Esophagus was found to be completely clear.   COMPLICATIONS: None.   DESCRIPTION OF PROCEDURE: After obtaining informed consent, the patient was taken to the operating room and placed in the supine position. After induction of general endotracheal anesthesia, the patient was turned 90 degrees. The head was draped with the eyes protected. A Dedo laryngoscope was then used to inspect the airway with the above findings. A rigid esophagoscope was then lubricated and carefully passed into the esophagus and the esophagus carefully evaluated. There were no lesions seen in the esophagus at all and the esophagoscopy proceeded smoothly without any trauma to the esophageal mucosa. Scope was removed and the Dedo scope reintroduced. Again, I reinspected the airway completely. There were no lesions seen in the piriform sinus or  involving the larynx. Vocal cords were found to be completely clear. The lesion itself was as described above and very bulky within the tongue base. I palpated the tongue base and it clearly crossed midline in the tongue base. Multiple biopsies were taken from the left base of tongue and tonsillar fossa region. There was moderate oozing from the area because of the vascular nature of the tumor but no arterial bleeding. Throat was suctioned to remove any blood clot and the patient was returned to the anesthesiologist for awakening. He was awakened and taken to the recovery room in good condition postoperatively.   ____________________________ Ollen GrossPaul S. Willeen CassBennett, MD psb:drc D: 04/15/2012 08:55:55 ET T: 04/15/2012 09:29:25 ET JOB#: [id number]  cc: Ollen GrossPaul S. Willeen CassBennett, MD, <Dictator> Sandi MealyPAUL S Lakiesha Ralphs MD ELECTRONICALLY SIGNED 04/15/2012 9:41
